# Patient Record
Sex: Male | Born: 2000 | Hispanic: Yes | Marital: Single | Smoking: Never smoker
Health system: Southern US, Community
[De-identification: ages and names within clinical notes are randomized; demographics above are authoritative.]

## PROBLEM LIST (undated history)

## (undated) DIAGNOSIS — J45909 Unspecified asthma, uncomplicated: Secondary | ICD-10-CM

---
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
## 2022-03-03 ENCOUNTER — Ambulatory Visit
Admission: EM | Admit: 2022-03-03 | Discharge: 2022-03-03 | Disposition: A | Discharge status: Home / Self Care | Payer: Medicaid Other

## 2022-03-03 DIAGNOSIS — M545 Low back pain, unspecified: Secondary | ICD-10-CM

## 2022-03-03 DIAGNOSIS — S39012A Strain of muscle, fascia and tendon of lower back, initial encounter: Secondary | ICD-10-CM

## 2022-03-03 HISTORY — DX: Unspecified asthma, uncomplicated: J45.909

## 2022-03-03 MED ORDER — CYCLOBENZAPRINE HCL 5 MG PO TABS: 5.0000 mg | ORAL_TABLET | Freq: Two times a day (BID) | ORAL | 0 refills | Status: DC | PRN

## 2022-03-03 MED ORDER — IBUPROFEN 600 MG PO TABS: 600.0000 mg | ORAL_TABLET | Freq: Four times a day (QID) | ORAL | 0 refills | Status: DC | PRN

## 2022-03-03 NOTE — Discharge Instructions (Signed)
It appears that you have strained some muscles in your lower back.  I have prescribed 2 different medications to help alleviate symptoms.  Cyclobenzaprine is a muscle relaxer and please be advised that it can cause drowsiness so do not drive or drink alcohol with taking it.  I have prescribed prescription ibuprofen as well.  Please do not take any additional ibuprofen, Advil, Aleve while taking this medication.  Alternate ice and heat to affected area.  Follow-up if symptoms persist or worsen.

## 2022-03-03 NOTE — ED Provider Notes (Signed)
[facility]    CSN: [id number] Arrival date & time: 03/03/22  1855      History   Chief Complaint Chief Complaint  Patient presents with   Back Pain    HPI Hector Cole is a 21 y.o. male.   Patient presents with lower back pain that has been present for about a week.  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  He has not taken any medications for pain but reports that he has applied Salonpas patches with minimal improvement.  Denies any numbness or tingling.  Pain does not radiate down legs.  Denies urinary frequency, urinary or bowel continence, saddle anesthesia.   Back Pain   Past Medical History:  Diagnosis Date   Asthma     There are no problems to display for this patient.   History reviewed. No pertinent surgical history.     Home Medications    Prior to Admission medications   Medication Sig Start Date End Date Taking? Authorizing Provider  albuterol (VENTOLIN HFA) 108 (90 Base) MCG/ACT inhaler Inhale into the lungs every 6 (six) hours as needed for wheezing or shortness of breath.   Yes [provider]  cyclobenzaprine (FLEXERIL) 5 MG tablet Take 1 tablet (5 mg total) by mouth 2 (two) times daily as needed for muscle spasms. 03/03/22  Yes Jayden Kratochvil, Rolly Salter E, FNP  ibuprofen (ADVIL) 600 MG tablet Take 1 tablet (600 mg total) by mouth every 6 (six) hours as needed for mild pain or moderate pain. 03/03/22  Yes Gustavus Bryant, FNP    Family History History reviewed. No pertinent family history.  Social History Social History   Tobacco Use   Smoking status: Never   Smokeless tobacco: Never  Vaping Use   Vaping Use: Never used  Substance Use Topics   Alcohol use: Never   Drug use: Never     Allergies   Patient has no known allergies.   Review of Systems Review of Systems Per HPI  Physical Exam Triage Vital Signs ED Triage Vitals   Enc Vitals Group     BP 03/03/22 1949 118/74     Pulse Rate 03/03/22 1949 75     Resp 03/03/22 1949 20     Temp 03/03/22 1949 98.5 F (36.9 C)     Temp Source 03/03/22 1949 Oral     SpO2 03/03/22 1949 98 %     Weight --      Height --      Head Circumference --      Peak Flow --      Pain Score 03/03/22 2005 7     Pain Loc --      Pain Edu? --      Excl. in GC? --    No data found.  Updated Vital Signs BP 118/74 (BP Location: Left Arm)   Pulse 75   Temp 98.5 F (36.9 C) (Oral)   Resp 20   SpO2 98%   Visual Acuity Right Eye Distance:   Left Eye Distance:   Bilateral Distance:    Right Eye Near:   Left Eye Near:    Bilateral Near:     Physical Exam Constitutional:      General: He is not in acute distress.    Appearance: Normal appearance. He is not toxic-appearing or diaphoretic.  HENT:     Head: Normocephalic and atraumatic.  Eyes:  Extraocular Movements: Extraocular movements intact.     Conjunctiva/sclera: Conjunctivae normal.  Pulmonary:     Effort: Pulmonary effort is normal.  Musculoskeletal:       Back:     Comments: Tenderness to palpation across lower lumbar region.  No crepitus or step-off noted.  No obvious swelling, discoloration, lacerations, abrasions noted.  Neurological:     General: No focal deficit present.     Mental Status: He is alert and oriented to person, place, and time. Mental status is at baseline.     Deep Tendon Reflexes: Reflexes are normal and symmetric.  Psychiatric:        Mood and Affect: Mood normal.        Behavior: Behavior normal.        Thought Content: Thought content normal.        Judgment: Judgment normal.      UC Treatments / Results  Labs (all labs ordered are listed, but only abnormal results are displayed) Labs Reviewed - No data to display  EKG   Radiology No results found.  Procedures Procedures (including critical care time)  Medications Ordered in UC Medications - No data to  display  Initial Impression / Assessment and Plan / UC Course  I have reviewed the triage vital signs and the nursing notes.  Pertinent labs & imaging results that were available during my care of the patient were reviewed by me and considered in my medical decision making (see chart for details).     Physical exam is consistent with muscle strain of the lumbar area.  Will treat with a muscle relaxer and NSAID.  Patient advised that muscle relaxer can cause drowsiness and do not drive or drink alcohol with taking it.  Advised patient not to take any additional NSAIDs as well.  Advised alternating ice and heat to affected area.  Do not think imaging is necessary given no direct spinal tenderness or direct injury.  Patient was advised to follow-up if symptoms persist or worsen.  Patient verbalized understanding and was agreeable with plan. Final Clinical Impressions(s) / UC Diagnoses   Final diagnoses:  Strain of lumbar region, initial encounter  Acute bilateral low back pain without sciatica     Discharge Instructions      It appears that you have strained some muscles in your lower back.  I have prescribed 2 different medications to help alleviate symptoms.  Cyclobenzaprine is a muscle relaxer and please be advised that it can cause drowsiness so do not drive or drink alcohol with taking it.  I have prescribed prescription ibuprofen as well.  Please do not take any additional ibuprofen, Advil, Aleve while taking this medication.  Alternate ice and heat to affected area.  Follow-up if symptoms persist or worsen.    ED Prescriptions     Medication Sig Dispense Auth. Provider   cyclobenzaprine (FLEXERIL) 5 MG tablet Take 1 tablet (5 mg total) by mouth 2 (two) times daily as needed for muscle spasms. 20 tablet [place], [place], [place]   ibuprofen (ADVIL) 600 MG tablet Take 1 tablet (600 mg total) by mouth every 6 (six) hours as needed for mild pain or moderate pain. 30 tablet [place], [place], [place]       I have reviewed the PDMP during this encounter.   Gustavus Bryant, [place] 03/03/22 2020

## 2022-03-03 NOTE — ED Triage Notes (Signed)
Pt reports generalized lumbar pain. 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  Used heat pad twice but only temporary improvement.

## 2022-03-14 ENCOUNTER — Emergency Department (HOSPITAL_COMMUNITY): Payer: Medicaid Other

## 2022-03-14 ENCOUNTER — Emergency Department (HOSPITAL_COMMUNITY)
Admission: EM | Admit: 2022-03-14 | Discharge: 2022-03-15 | Disposition: A | Discharge status: Home / Self Care | Payer: Medicaid Other | Attending: Emergency Medicine | Admitting: Emergency Medicine

## 2022-03-14 ENCOUNTER — Encounter (HOSPITAL_COMMUNITY): Payer: Self-pay | Admitting: Emergency Medicine

## 2022-03-14 DIAGNOSIS — N50812 Left testicular pain: Secondary | ICD-10-CM

## 2022-03-14 DIAGNOSIS — Z7951 Long term (current) use of inhaled steroids: Secondary | ICD-10-CM | POA: Diagnosis not present

## 2022-03-14 DIAGNOSIS — J45909 Unspecified asthma, uncomplicated: Secondary | ICD-10-CM | POA: Insufficient documentation

## 2022-03-14 LAB — URINALYSIS, ROUTINE W REFLEX MICROSCOPIC
Bilirubin Urine: NEGATIVE
Glucose, UA: NEGATIVE mg/dL
Hgb urine dipstick: NEGATIVE
Ketones, ur: NEGATIVE mg/dL
Leukocytes,Ua: NEGATIVE
Nitrite: NEGATIVE
Protein, ur: NEGATIVE mg/dL
Specific Gravity, Urine: 1.023 (ref 1.005–1.030)
pH: 6 (ref 5.0–8.0)

## 2022-03-14 NOTE — ED Triage Notes (Addendum)
Pt reports left testicular pain, denies pain with urination. Pain with walking. Denies trauma. Last year pt had same issue and US performed. He did not follow up with urology.  Reports no pain with intercourse except when testicle hits the other person.

## 2022-03-14 NOTE — ED Provider Triage Note (Signed)
Emergency Medicine Provider Triage Evaluation Note  Hector Cole , a 21 y.o. male  was evaluated in triage.  Pt complains of left testicle pain.   Denies trauma.  No urinary symptoms or penile discharge.  No fever.  Hx similar last year but never followed up with urology.  Review of Systems  Positive: Testicle pain Negative: fever  Physical Exam  BP 127/80 (BP Location: Right Arm)   Pulse 80   Temp 98.1 F (36.7 C) (Oral)   Resp 16   SpO2 99%  Gen:   Awake, no distress   Resp:  Normal effort  MSK:   Moves extremities without difficulty  Other:  Genital exam deferred in triage  Medical Decision Making  Medically screening exam initiated at 10:08 PM.  Appropriate orders placed.  Rodriques Badie was informed that the remainder of the evaluation will be completed by another provider, this initial triage assessment does not replace that evaluation, and the importance of remaining in the ED until their evaluation is complete.  Testicle pain.  Denies trauma, urinary symptoms, or discharge.  Will order [place] and UA.   Garlon Hatchet, PA-C 03/14/22 2210

## 2022-03-15 LAB — HIV ANTIBODY (ROUTINE TESTING W REFLEX): HIV Screen 4th Generation wRfx: NONREACTIVE

## 2022-03-15 NOTE — ED Provider Notes (Signed)
[facility] EMERGENCY DEPARTMENT Provider Note   CSN: [id number] Arrival date & time: 03/14/22  2049     History  Chief Complaint  Patient presents with   Testicle Pain    Hector Cole is a 21 y.o. male.  Patient presents emergency department complaining of left-sided testicle pain which been ongoing for approximately 1 week.  Patient denies any hematuria, dysuria, abdominal pain, back pain.  He does report unprotected sex.  He was seen with a similar issue last year at approximate the same time and was recommended to follow-up with urology but states he was unaware that he was was to follow-up or schedule a follow-up appointment.  Past medical history significant for asthma  HPI     Home Medications Prior to Admission medications   Medication Sig Start Date End Date Taking? Authorizing Provider  albuterol (VENTOLIN HFA) 108 (90 Base) MCG/ACT inhaler Inhale into the lungs every 6 (six) hours as needed for wheezing or shortness of breath.    [provider]  cyclobenzaprine (FLEXERIL) 5 MG tablet Take 1 tablet (5 mg total) by mouth 2 (two) times daily as needed for muscle spasms. 03/03/22   Gustavus Bryant, FNP  ibuprofen (ADVIL) 600 MG tablet Take 1 tablet (600 mg total) by mouth every 6 (six) hours as needed for mild pain or moderate pain. 03/03/22   Gustavus Bryant, FNP      Allergies    Patient has no known allergies.    Review of Systems   Review of Systems  Genitourinary:  Positive for testicular pain.    Physical Exam Updated Vital Signs BP 137/75 (BP Location: Right Arm)   Pulse 71   Temp 97.8 F (36.6 C) (Oral)   Resp 16   SpO2 100%  Physical Exam Exam conducted with a chaperone present.  HENT:     Head: Normocephalic and atraumatic.  Eyes:     Pupils: Pupils are equal, round, and reactive to light.  Pulmonary:     Effort: Pulmonary effort is normal. No respiratory distress.  Genitourinary:    Penis: Uncircumcised.       Testes:        Right: Mass, tenderness, swelling, testicular hydrocele or varicocele not present. Right testis is descended. Cremasteric reflex is present.         Left: Tenderness present. Mass, swelling, testicular hydrocele or varicocele not present. Left testis is descended. Cremasteric reflex is present.   Musculoskeletal:        General: No signs of injury.     Cervical back: Normal range of motion.  Skin:    General: Skin is dry.  Neurological:     Mental Status: He is alert.  Psychiatric:        Speech: Speech normal.        Behavior: Behavior normal.     ED Results / Procedures / Treatments   Labs (all labs ordered are listed, but only abnormal results are displayed) Labs Reviewed  URINALYSIS, ROUTINE W REFLEX MICROSCOPIC  HIV ANTIBODY (ROUTINE TESTING W REFLEX)  GC/CHLAMYDIA PROBE AMP ([place]) NOT AT [facility]    EKG None  Radiology US SCROTUM W/DOPPLER  Result Date: 03/15/2022 CLINICAL DATA:  Left testicle pain EXAM: SCROTAL ULTRASOUND DOPPLER ULTRASOUND OF THE TESTICLES TECHNIQUE: Complete ultrasound examination of the testicles, epididymis, and other scrotal structures was performed. Color and spectral Doppler ultrasound were also utilized to evaluate blood flow to the testicles. COMPARISON:  Ultrasound 04/16/2020 FINDINGS: Right testicle Measurements:  5.3 x 2.0 x 3.2 cm. No mass or microlithiasis visualized. Left testicle Measurements: 4.8 x 2.1 x 2.8 cm. No mass or microlithiasis visualized. Right epididymis:  Normal in size and appearance. Left epididymis:  Normal in size and appearance. Hydrocele:  Trace bilateral hydroceles. Varicocele:  None visualized. Pulsed Doppler interrogation of both testes demonstrates normal low resistance arterial and venous waveforms bilaterally. IMPRESSION: Trace bilateral hydroceles. Otherwise unremarkable testicular ultrasound. Electronically Signed   By: Minerva Fester M.D.   On: 03/15/2022 00:18    Procedures Procedures     Medications Ordered in ED Medications - No data to display  ED Course/ Medical Decision Making/ A&P                           Medical Decision Making  Patient presents to complaint of left-sided testicular pain.  Differential diagnosis includes epididymitis, torsion, injury, and others  I reviewed the patient's past medical history.  Patient was evaluated for similar complaints approximately 1 year ago.  It was recommended that he follow-up with urology at that time but the patient misunderstood the instructions.  No torsion was noted that time.  Small hydroceles were noted on that evaluation  I ordered and interpreted imaging including ultrasound of the testicles.  No torsion or other abnormality noted.  I agree with radiologist's findings  I ordered and reviewed lab results.  Pertinent results include unremarkable UA, negative HIV screen, GC chlamydia probe pending  There is no obvious emergent condition at this time.  Unclear etiology of patient's left-sided testicular pain.  There is no torsion or abnormal finding on ultrasound.  Physical exam was unremarkable except for tenderness.  Clinically this does not appear to epididymitis at this time.  Plan to discharge patient home with plans for follow-up as needed based on GC chlamydia probe.  Patient has been instructed clearly to schedule a follow-up appointment with urology for further evaluation and management.         Final Clinical Impression(s) / ED Diagnoses Final diagnoses:  Pain in left testicle    Rx / DC Orders ED Discharge Orders     None         Pamala Duffel 03/15/22 8938    Mesner, Barbara Cower, MD 03/15/22 [phone]

## 2022-03-15 NOTE — Discharge Instructions (Signed)
You were evaluated today for testicular pain.  Your ultrasound was reassuring for no signs of torsion at this time.  The testing for STIs is pending at this time.  Please follow-up MyChart for results of the testing.  You should be contacted if the test are positive start antibiotic therapy.  Please contact the listed urologist to schedule a follow-up appointment for further evaluation and management.  You may try over-the-counter pain medications at this time to see if they help with symptom relief

## 2022-03-17 LAB — GC/CHLAMYDIA PROBE AMP (~~LOC~~) NOT AT ARMC
Chlamydia: NEGATIVE
Comment: NEGATIVE
Comment: NORMAL
Neisseria Gonorrhea: NEGATIVE

## 2022-06-17 IMAGING — US US SCROTUM W/ DOPPLER COMPLETE
1 series · 14 of 25 positions shown · non-contrast
Comparison: None.

CLINICAL DATA: Left testicular pain

EXAM:
SCROTAL ULTRASOUND
DOPPLER ULTRASOUND OF THE TESTICLES
TECHNIQUE: Complete ultrasound examination of the testicles, epididymis, and
other scrotal structures was performed. Color and spectral Doppler
ultrasound were also utilized to evaluate blood flow to the
testicles.

[Series 1: us scrotum w/doppler · 14 of 60 slices shown]
[im 1/60]
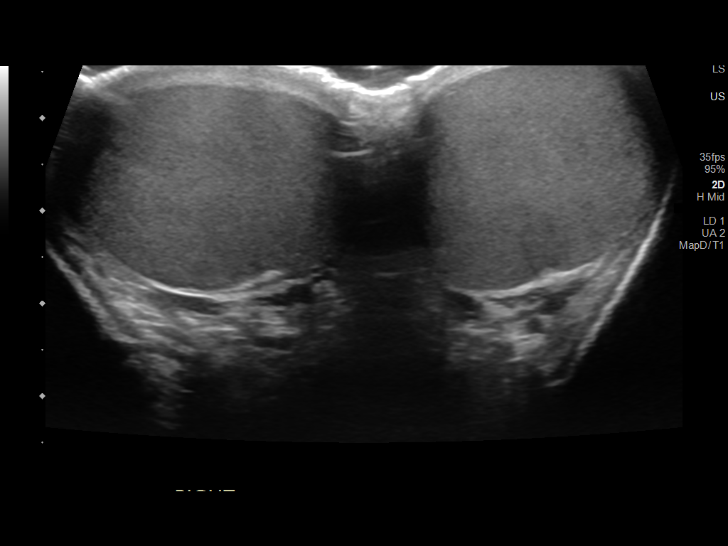
[im 5/60]
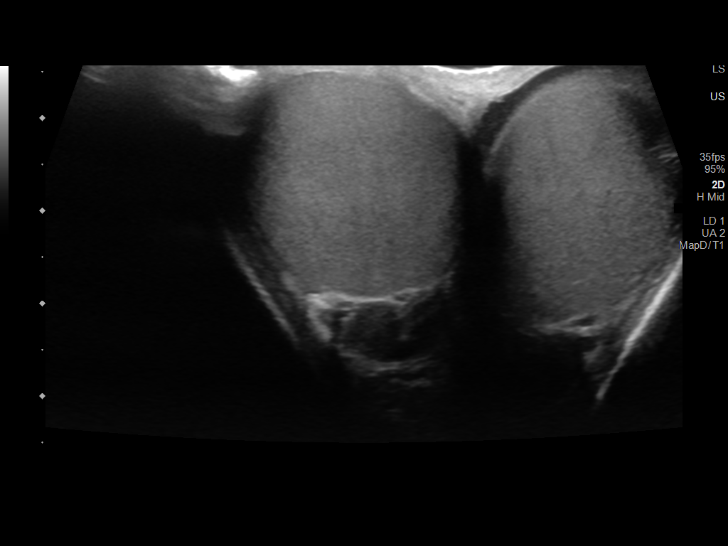
[im 10/60]
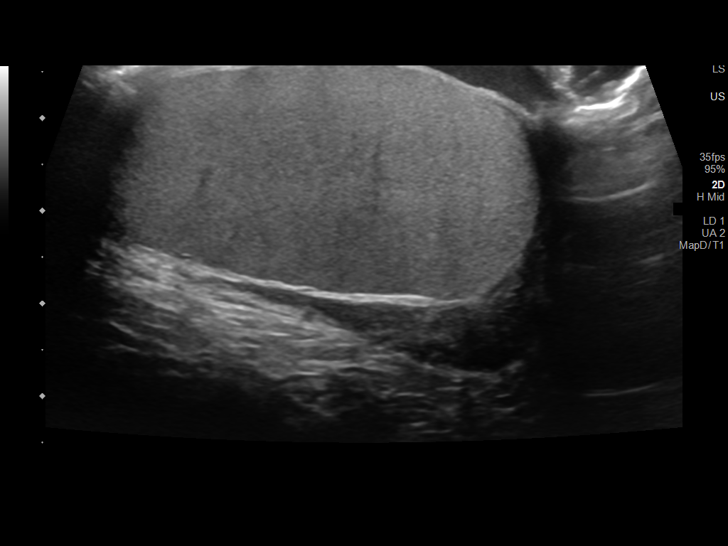
[im 15/60]
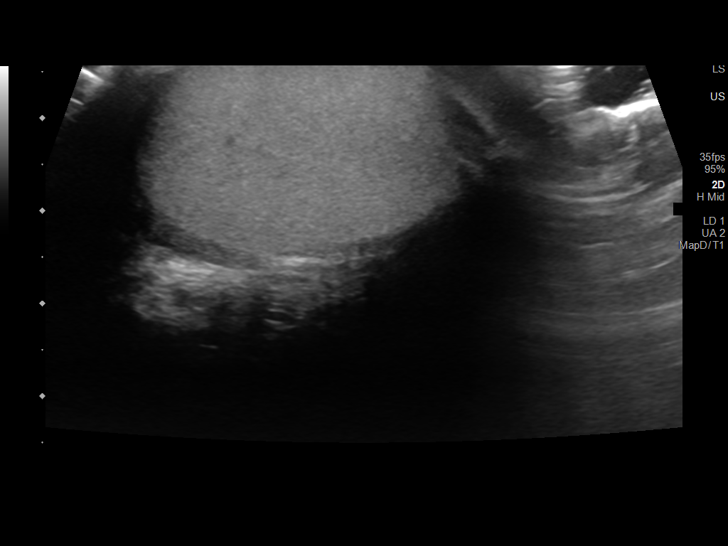
[im 20/60]
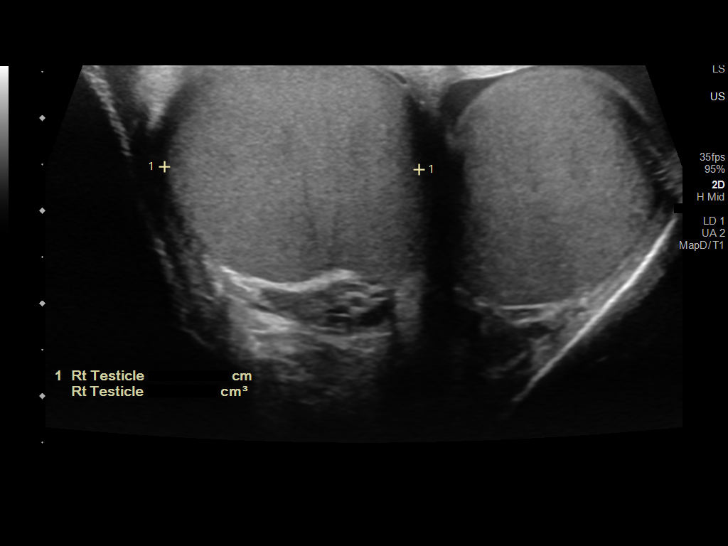
[im 23/60]
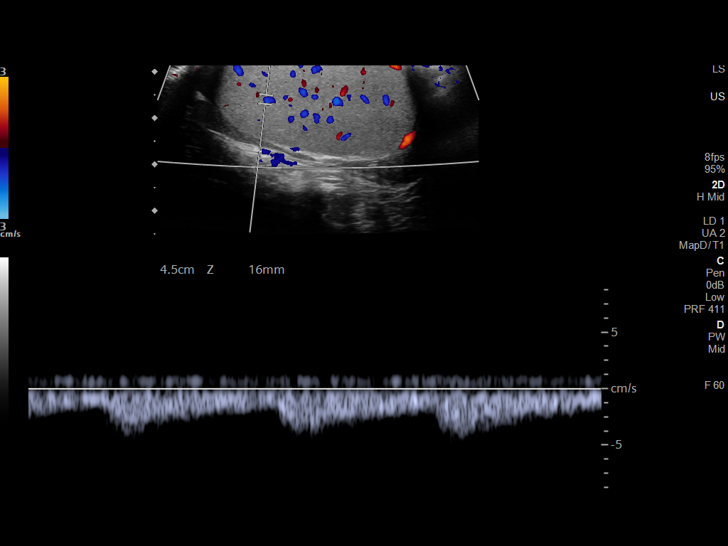
[im 28/60]
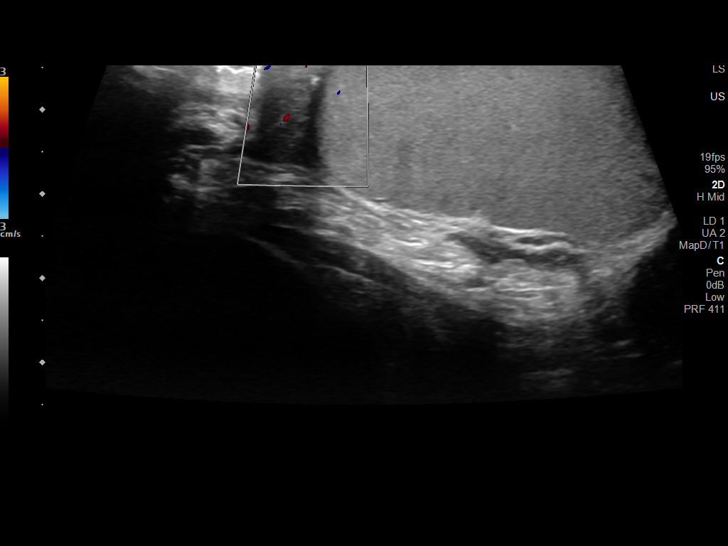
[im 32/60]
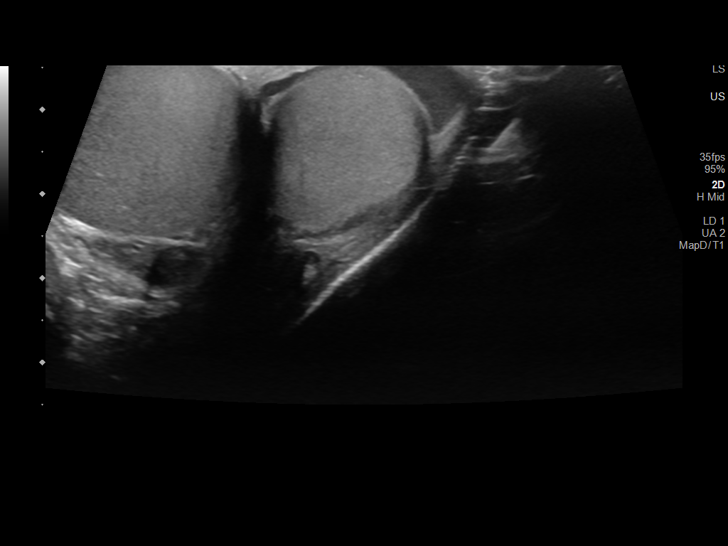
[im 37/60]
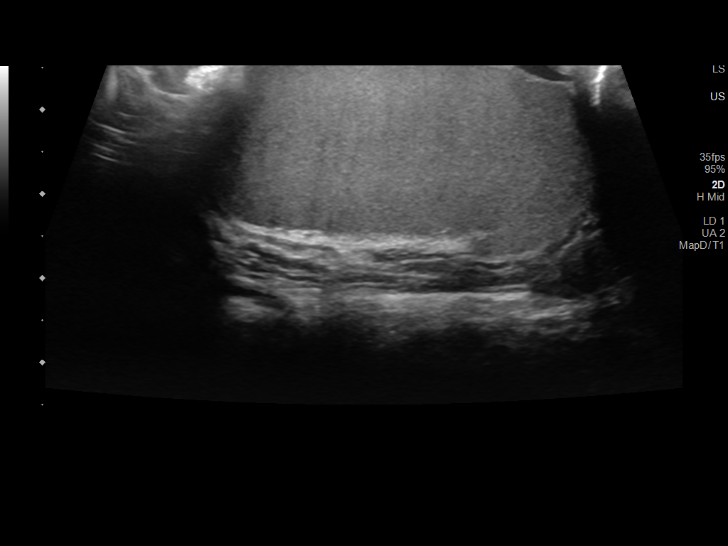
[im 40/60]
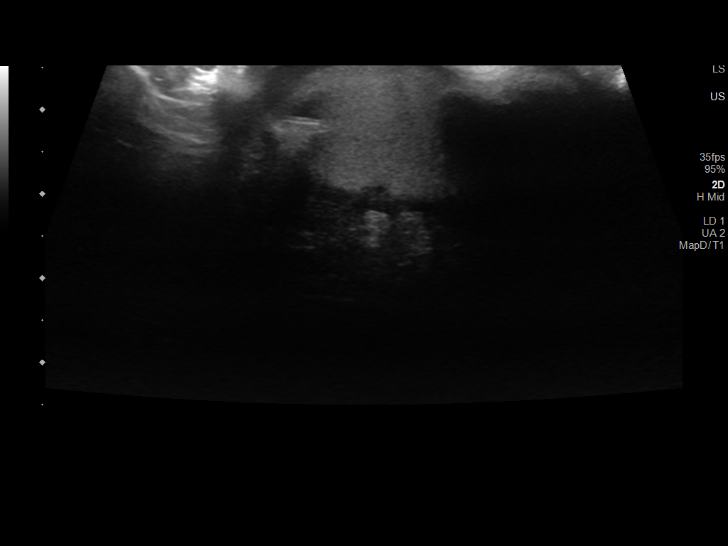
[im 45/60]
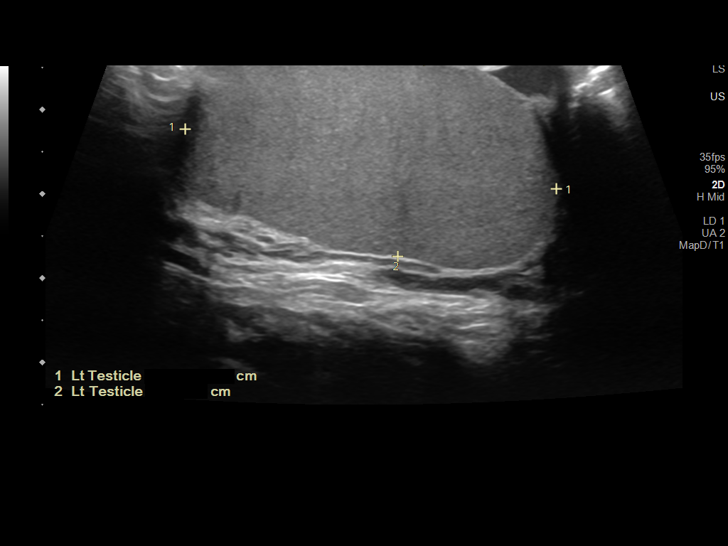
[im 50/60]
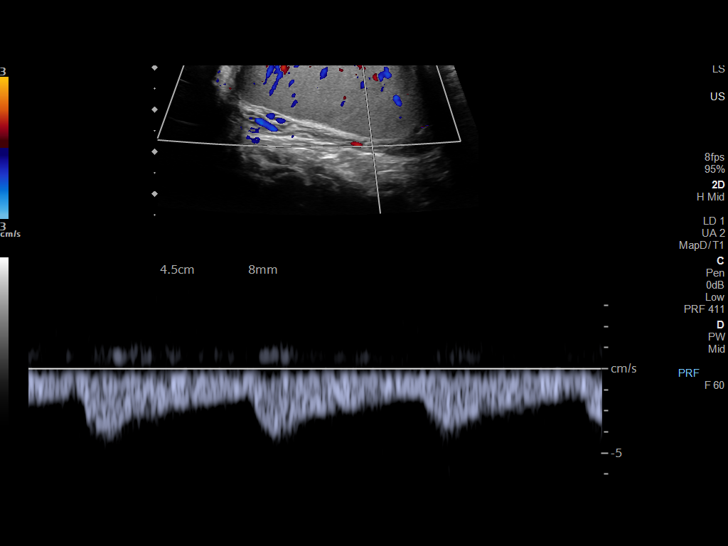
[im 55/60]
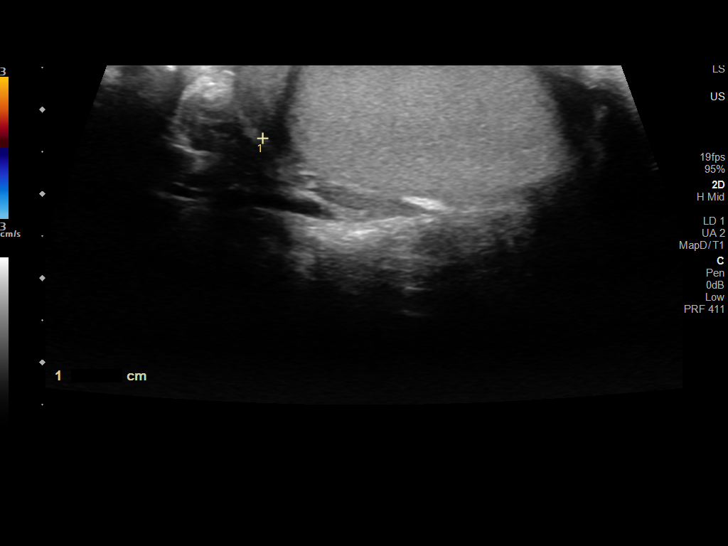
[im 60/60]
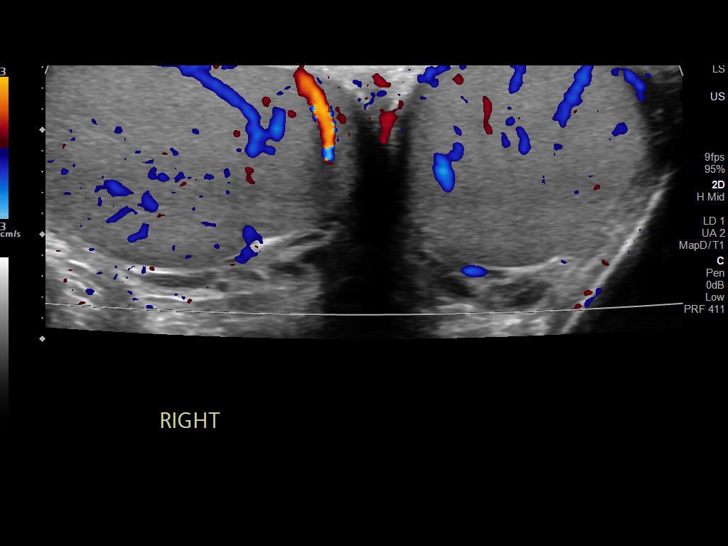

[14 of 25 positions shown; findings below may reference images not displayed]

FINDINGS: Right testicle

Measurements: 4.7 x 2.3 x 2.8 cm. No mass or microlithiasis
visualized.

Left testicle

Measurements: 4.5 x 2.4 x 2.7 cm. No mass or microlithiasis
visualized.

Right epididymis:  Normal in size and appearance.

Left epididymis:  Normal in size and appearance.

Hydrocele:  Trace bilateral hydroceles.

Varicocele:  None visualized.

Pulsed Doppler interrogation of both testes demonstrates normal low
resistance arterial and venous waveforms bilaterally.
IMPRESSION: 1. Trace bilateral hydroceles. Otherwise unremarkable testicular
ultrasound.

## 2022-06-22 ENCOUNTER — Ambulatory Visit: Payer: Self-pay

## 2022-06-22 ENCOUNTER — Ambulatory Visit
Admission: EM | Admit: 2022-06-22 | Discharge: 2022-06-22 | Disposition: A | Discharge status: Home / Self Care | Payer: Medicaid Other | Attending: Urgent Care | Admitting: Urgent Care

## 2022-06-22 DIAGNOSIS — J069 Acute upper respiratory infection, unspecified: Secondary | ICD-10-CM | POA: Diagnosis not present

## 2022-06-22 MED ORDER — AZITHROMYCIN 250 MG PO TABS: 250.0000 mg | ORAL_TABLET | Freq: Every day | ORAL | 0 refills | Status: DC

## 2022-06-22 MED ORDER — PREDNISONE 50 MG PO TABS: 50.0000 mg | ORAL_TABLET | Freq: Every day | ORAL | 0 refills | Status: DC

## 2022-06-22 MED ORDER — MONTELUKAST SODIUM 10 MG PO TABS: 10.0000 mg | ORAL_TABLET | Freq: Every day | ORAL | 0 refills | Status: DC

## 2022-06-22 MED ORDER — BENZONATATE 100 MG PO CAPS: 100.0000 mg | ORAL_CAPSULE | Freq: Three times a day (TID) | ORAL | 0 refills | Status: DC | PRN

## 2022-06-22 NOTE — ED Triage Notes (Signed)
Pt presents with c/o pressure in chst, sore throat, sneezing, cough, sinus pressure X 4 days.   States he has taken DayQuil and NyQuil.

## 2022-06-22 NOTE — Discharge Instructions (Addendum)
Please start taking the azithromycin this evening, 2 tablets today, followed by 1 tablet tomorrow through day 5.  Follow the package instructions. Please also start taking montelukast.  This will be 1 tablet every evening.  It may cause drowsiness so do not take in the morning. Take prednisone once daily every morning with breakfast. Use the benzonatate cough Perles only as needed for your cough. Try to stay hydrated with water, consider the use of over-the-counter Mucinex 600 mg twice daily.

## 2022-06-22 NOTE — ED Provider Notes (Signed)
[facility]    CSN: [id number] Arrival date & time: 06/22/22  1849      History   Chief Complaint Chief Complaint  Patient presents with   Nasal Congestion   Cough    HPI Abdirahim Kichline is a 22 y.o. male.   Pleasant 22 year old male with a known history of asthma in the past presents today due to concerns of nasal congestion, cough, and shortness of breath.  Patient has a home Ventolin inhaler, states he took it yesterday as his last dose.  Felt that it was ineffective for his current symptoms.  Denies any tightness in his chest today.  Denies any fever.  Endorses postnasal drainage, sneezing, and congestion.  Patient has taken DayQuil and NyQuil without any improvement of symptoms.   Cough   Past Medical History:  Diagnosis Date   Asthma     There are no problems to display for this patient.   History reviewed. No pertinent surgical history.     Home Medications    Prior to Admission medications   Medication Sig Start Date End Date Taking? Authorizing Provider  azithromycin (ZITHROMAX) 250 MG tablet Take 1 tablet (250 mg total) by mouth daily. Take first 2 tablets together, then 1 every day until finished. 06/22/22  Yes Akeiba Axelson L, PA  benzonatate (TESSALON PERLES) 100 MG capsule Take 1 capsule (100 mg total) by mouth 3 (three) times daily as needed for cough. 06/22/22  Yes Dajour Pierpoint L, PA  montelukast (SINGULAIR) 10 MG tablet Take 1 tablet (10 mg total) by mouth at bedtime. 06/22/22  Yes Rubee Vega L, PA  predniSONE (DELTASONE) 50 MG tablet Take 1 tablet (50 mg total) by mouth daily with breakfast. 06/22/22  Yes Liviah Cake L, PA  albuterol (VENTOLIN HFA) 108 (90 Base) MCG/ACT inhaler Inhale into the lungs every 6 (six) hours as needed for wheezing or shortness of breath.    [provider]  cyclobenzaprine (FLEXERIL) 5 MG tablet Take 1 tablet (5 mg total) by mouth 2 (two) times daily as needed for muscle spasms. 03/03/22   Teodora Medici, FNP  ibuprofen (ADVIL) 600 MG tablet Take 1 tablet (600 mg total) by mouth every 6 (six) hours as needed for mild pain or moderate pain. 03/03/22   Teodora Medici, FNP    Family History History reviewed. No pertinent family history.  Social History Social History   Tobacco Use   Smoking status: Never   Smokeless tobacco: Never  Vaping Use   Vaping Use: Never used  Substance Use Topics   Alcohol use: Never   Drug use: Never     Allergies   Patient has no known allergies.   Review of Systems Review of Systems  Respiratory:  Positive for cough.    As per HPI  Physical Exam Triage Vital Signs ED Triage Vitals  Enc Vitals Group     BP 06/22/22 1917 (!) 134/90     Pulse Rate 06/22/22 1917 83     Resp 06/22/22 1917 18     Temp 06/22/22 1917 97.7 F (36.5 C)     Temp Source 06/22/22 1917 Oral     SpO2 06/22/22 1917 97 %     Weight --      Height --      Head Circumference --      Peak Flow --      Pain Score 06/22/22 1915 2     Pain Loc --  Pain Edu? --      Excl. in [place]? --    No data found.  Updated Vital Signs BP (!) 134/90 (BP Location: Right Arm)   Pulse 83   Temp 97.7 F (36.5 C) (Oral)   Resp 18   SpO2 97%   Visual Acuity Right Eye Distance:   Left Eye Distance:   Bilateral Distance:    Right Eye Near:   Left Eye Near:    Bilateral Near:     Physical Exam Vitals and nursing note reviewed.  Constitutional:      General: He is not in acute distress.    Appearance: Normal appearance. He is normal weight. He is not ill-appearing, toxic-appearing or diaphoretic.  HENT:     Head: Normocephalic and atraumatic.     Right Ear: Tympanic membrane and ear canal normal. No drainage, swelling or tenderness. No middle ear effusion. Tympanic membrane is not erythematous.     Left Ear: Tympanic membrane and ear canal normal. No drainage, swelling or tenderness.  No middle ear effusion. Tympanic membrane is not erythematous.     Nose: Congestion and  rhinorrhea present.     Mouth/Throat:     Mouth: Mucous membranes are moist. No oral lesions.     Pharynx: Oropharyngeal exudate present. No pharyngeal swelling, posterior oropharyngeal erythema or uvula swelling.     Tonsils: No tonsillar exudate or tonsillar abscesses.     Comments: Thick mucopurulent discharge in posterior pharynx Eyes:     General: No scleral icterus.       Right eye: No discharge.        Left eye: No discharge.     Extraocular Movements:     Left eye: Normal extraocular motion.     Conjunctiva/sclera: Conjunctivae normal.     Pupils: Pupils are equal, round, and reactive to light.  Neck:     Thyroid: No thyromegaly.  Cardiovascular:     Rate and Rhythm: Normal rate and regular rhythm.     Heart sounds: Normal heart sounds. No murmur heard.    No friction rub. No gallop.  Pulmonary:     Effort: Pulmonary effort is normal. No respiratory distress.     Breath sounds: No stridor. Rhonchi present. No wheezing or rales.  Chest:     Chest wall: No tenderness.  Abdominal:     Palpations: Abdomen is soft.  Musculoskeletal:     Cervical back: Normal range of motion and neck supple.  Lymphadenopathy:     Cervical: No cervical adenopathy.  Skin:    General: Skin is warm.     Capillary Refill: Capillary refill takes less than 2 seconds.     Coloration: Skin is not pale.     Findings: No erythema or rash.  Neurological:     General: No focal deficit present.     Mental Status: He is alert and oriented to person, place, and time.  Psychiatric:        Mood and Affect: Mood normal.        Behavior: Behavior normal.      UC Treatments / Results  Labs (all labs ordered are listed, but only abnormal results are displayed) Labs Reviewed - No data to display  EKG   Radiology No results found.  Procedures Procedures (including critical care time)  Medications Ordered in UC Medications - No data to display  Initial Impression / Assessment and Plan / UC  Course  I have reviewed the triage vital signs and the  nursing notes.  Pertinent labs & imaging results that were available during my care of the patient were reviewed by me and considered in my medical decision making (see chart for details).     Acute URI -patient with history of asthma and allergies, with acute onset of worsening symptoms.  Given appearance of nasal congestion and throat, suspect bacterial in nature.  Will start azithromycin, prednisone, montelukast.  As needed benzonatate.  Return to clinic precautions discussed.   Final Clinical Impressions(s) / UC Diagnoses   Final diagnoses:  Acute upper respiratory infection     Discharge Instructions      Please start taking the azithromycin this evening, 2 tablets today, followed by 1 tablet tomorrow through day 5.  Follow the package instructions. Please also start taking montelukast.  This will be 1 tablet every evening.  It may cause drowsiness so do not take in the morning. Take prednisone once daily every morning with breakfast. Use the benzonatate cough Perles only as needed for your cough. Try to stay hydrated with water, consider the use of over-the-counter Mucinex 600 mg twice daily.     ED Prescriptions     Medication Sig Dispense Auth. Provider   predniSONE (DELTASONE) 50 MG tablet Take 1 tablet (50 mg total) by mouth daily with breakfast. 6 tablet Ayasha Ellingsen L, PA   azithromycin (ZITHROMAX) 250 MG tablet Take 1 tablet (250 mg total) by mouth daily. Take first 2 tablets together, then 1 every day until finished. 6 tablet Xitlalli Newhard L, PA   montelukast (SINGULAIR) 10 MG tablet Take 1 tablet (10 mg total) by mouth at bedtime. 30 tablet Brynlei Klausner L, PA   benzonatate (TESSALON PERLES) 100 MG capsule Take 1 capsule (100 mg total) by mouth 3 (three) times daily as needed for cough. 20 capsule Cianni Manny L, PA      PDMP not reviewed this encounter.   Chaney Malling, [place] 06/22/22 2044

## 2022-07-13 ENCOUNTER — Ambulatory Visit
Admission: RE | Admit: 2022-07-13 | Discharge: 2022-07-13 | Disposition: A | Discharge status: Home / Self Care | Payer: Medicaid Other | Source: Ambulatory Visit | Attending: Emergency Medicine | Admitting: Emergency Medicine

## 2022-07-13 VITALS — BP 122/70 | HR 68 | Temp 98.4°F | Resp 17

## 2022-07-13 DIAGNOSIS — S60459A Superficial foreign body of unspecified finger, initial encounter: Secondary | ICD-10-CM | POA: Diagnosis not present

## 2022-07-13 MED ORDER — IBUPROFEN 800 MG PO TABS
800.0000 mg | ORAL_TABLET | Freq: Once | ORAL | Status: AC
  Administered 2022-07-13: 800 mg via ORAL

## 2022-07-13 MED ORDER — SULFAMETHOXAZOLE-TRIMETHOPRIM 800-160 MG PO TABS: 1.0000 | ORAL_TABLET | Freq: Two times a day (BID) | ORAL | 0 refills | Status: AC

## 2022-07-13 NOTE — ED Provider Notes (Signed)
[facility]    CSN: [id number] Arrival date & time: 07/13/22  1155    HISTORY   Chief Complaint  Patient presents with   Finger Injury    Entered by patient   HPI Hector Cole is a pleasant, 22 y.o. male who presents to urgent care today. Patient complains of getting a splinter in the tip of his right index finger at work today, states it is a piece of wood.  Patient states he does not know what to do about it.  The history is provided by the patient.   Past Medical History:  Diagnosis Date   Asthma    There are no problems to display for this patient.  History reviewed. No pertinent surgical history.  Home Medications    Prior to Admission medications   Medication Sig Start Date End Date Taking? Authorizing Provider  sulfamethoxazole-trimethoprim (BACTRIM DS) 800-160 MG tablet Take 1 tablet by mouth 2 (two) times daily for 5 days. 07/13/22 07/18/22 Yes Theadora Rama Scales, PA-C  albuterol (VENTOLIN HFA) 108 (90 Base) MCG/ACT inhaler Inhale into the lungs every 6 (six) hours as needed for wheezing or shortness of breath.    [provider]    Family History History reviewed. No pertinent family history. Social History Social History   Tobacco Use   Smoking status: Never   Smokeless tobacco: Never  Vaping Use   Vaping Use: Never used  Substance Use Topics   Alcohol use: Never   Drug use: Never   Allergies   Patient has no known allergies.  Review of Systems Review of Systems Pertinent findings revealed after performing a 14 point review of systems has been noted in the history of present illness.  Physical Exam Vital Signs BP 122/70 (BP Location: Right Arm)   Pulse 68   Temp 98.4 F (36.9 C) (Oral)   Resp 17   SpO2 97%   No data found.  Physical Exam Vitals and nursing note reviewed.  Constitutional:      General: He is not in acute distress.    Appearance: Normal appearance. He is normal weight. He is not ill-appearing.   HENT:     Head: Normocephalic and atraumatic.  Eyes:     Extraocular Movements: Extraocular movements intact.     Conjunctiva/sclera: Conjunctivae normal.     Pupils: Pupils are equal, round, and reactive to light.  Cardiovascular:     Rate and Rhythm: Normal rate and regular rhythm.  Pulmonary:     Effort: Pulmonary effort is normal.     Breath sounds: Normal breath sounds.  Musculoskeletal:        General: Normal range of motion.     Cervical back: Normal range of motion and neck supple.  Skin:    General: Skin is warm and dry.     Comments: Small wound at point of entry of splinter appreciated tip of right index finger  Neurological:     General: No focal deficit present.     Mental Status: He is alert and oriented to person, place, and time. Mental status is at baseline.  Psychiatric:        Mood and Affect: Mood normal.        Behavior: Behavior normal.        Thought Content: Thought content normal.        Judgment: Judgment normal.     Visual Acuity Right Eye Distance:   Left Eye Distance:   Bilateral Distance:    Right  Eye Near:   Left Eye Near:    Bilateral Near:     UC Couse / Diagnostics / Procedures:     Radiology No results found.  Procedures Procedures (including critical care time) EKG  Pending results:  Labs Reviewed - No data to display  Medications Ordered in UC: Medications  ibuprofen (ADVIL) tablet 800 mg (800 mg Oral Given 07/13/22 1216)    UC Diagnoses / Final Clinical Impressions(s)   I have reviewed the triage vital signs and the nursing notes.  Pertinent labs & imaging results that were available during my care of the patient were reviewed by me and considered in my medical decision making (see chart for details).    Final diagnoses:  Splinter of finger without major open wound or infection, initial encounter   Patient advised to soak finger and allow the splinter to come out on its own.  Patient provided with a 5-day course of  Bactrim for infection prevention.  Return precautions advised.  Please see discharge instructions below for details of plan of care as provided to patient. ED Prescriptions     Medication Sig Dispense Auth. Provider   sulfamethoxazole-trimethoprim (BACTRIM DS) 800-160 MG tablet Take 1 tablet by mouth 2 (two) times daily for 5 days. 10 tablet Theadora Rama Scales, PA-C      PDMP not reviewed this encounter.  Pending results:  Labs Reviewed - No data to display  Discharge Instructions:   Discharge Instructions      Please soak your finger in warm water with some Epsom salt added for 20 minutes at least 2-3 times daily.  Please begin Bactrim 1 tablet twice daily for the next 5 days to prevent infection.  You are welcome to take ibuprofen as needed for pain.    Disposition Upon Discharge:  Condition: stable for discharge home  Patient presented with an acute illness with associated systemic symptoms and significant discomfort requiring urgent management. In my opinion, this is a condition that a prudent lay person (someone who possesses an average knowledge of health and medicine) may potentially expect to result in complications if not addressed urgently such as respiratory distress, impairment of bodily function or dysfunction of bodily organs.   Routine symptom specific, illness specific and/or disease specific instructions were discussed with the patient and/or caregiver at length.   As such, the patient has been evaluated and assessed, work-up was performed and treatment was provided in alignment with urgent care protocols and evidence based medicine.  Patient/parent/caregiver has been advised that the patient may require follow up for further testing and treatment if the symptoms continue in spite of treatment, as clinically indicated and appropriate.  Patient/parent/caregiver has been advised to return to the [facility] or PCP if no better; to PCP or the Emergency Department if new  signs and symptoms develop, or if the current signs or symptoms continue to change or worsen for further workup, evaluation and treatment as clinically indicated and appropriate  The patient will follow up with their current PCP if and as advised. If the patient does not currently have a PCP we will assist them in obtaining one.   The patient may need specialty follow up if the symptoms continue, in spite of conservative treatment and management, for further workup, evaluation, consultation and treatment as clinically indicated and appropriate.  Patient/parent/caregiver verbalized understanding and agreement of plan as discussed.  All questions were addressed during visit.  Please see discharge instructions below for further details of plan.  This office  note has been dictated using Teaching laboratory technician.  Unfortunately, this method of dictation can sometimes lead to typographical or grammatical errors.  I apologize for your inconvenience in advance if this occurs.  Please do not hesitate to reach out to me if clarification is needed.      Theadora Rama Scales, PA-C 07/13/22 1217

## 2022-07-13 NOTE — ED Triage Notes (Signed)
Pt was at work today and obtained splinter in right pointer finger.

## 2022-07-13 NOTE — Discharge Instructions (Signed)
Please soak your finger in warm water with some Epsom salt added for 20 minutes at least 2-3 times daily.  Please begin Bactrim 1 tablet twice daily for the next 5 days to prevent infection.  You are welcome to take ibuprofen as needed for pain.

## 2023-08-06 ENCOUNTER — Other Ambulatory Visit: Payer: Self-pay

## 2023-08-06 ENCOUNTER — Encounter (HOSPITAL_BASED_OUTPATIENT_CLINIC_OR_DEPARTMENT_OTHER): Payer: Self-pay | Admitting: Emergency Medicine

## 2023-08-06 ENCOUNTER — Emergency Department (HOSPITAL_BASED_OUTPATIENT_CLINIC_OR_DEPARTMENT_OTHER): Admitting: Radiology

## 2023-08-06 ENCOUNTER — Emergency Department (HOSPITAL_BASED_OUTPATIENT_CLINIC_OR_DEPARTMENT_OTHER)
Admission: EM | Admit: 2023-08-06 | Discharge: 2023-08-06 | Disposition: A | Discharge status: Home / Self Care | Attending: Emergency Medicine | Admitting: Emergency Medicine

## 2023-08-06 ENCOUNTER — Ambulatory Visit: Payer: Self-pay

## 2023-08-06 ENCOUNTER — Other Ambulatory Visit (HOSPITAL_BASED_OUTPATIENT_CLINIC_OR_DEPARTMENT_OTHER): Payer: Self-pay

## 2023-08-06 DIAGNOSIS — R944 Abnormal results of kidney function studies: Secondary | ICD-10-CM | POA: Diagnosis not present

## 2023-08-06 DIAGNOSIS — J45909 Unspecified asthma, uncomplicated: Secondary | ICD-10-CM | POA: Insufficient documentation

## 2023-08-06 DIAGNOSIS — R0789 Other chest pain: Secondary | ICD-10-CM | POA: Insufficient documentation

## 2023-08-06 DIAGNOSIS — R1013 Epigastric pain: Secondary | ICD-10-CM | POA: Diagnosis not present

## 2023-08-06 LAB — URINALYSIS, ROUTINE W REFLEX MICROSCOPIC
Bilirubin Urine: NEGATIVE
Glucose, UA: NEGATIVE mg/dL
Hgb urine dipstick: NEGATIVE
Ketones, ur: NEGATIVE mg/dL
Leukocytes,Ua: NEGATIVE
Nitrite: NEGATIVE
Specific Gravity, Urine: 1.038 — ABNORMAL HIGH (ref 1.005–1.030)
pH: 6.5 (ref 5.0–8.0)

## 2023-08-06 LAB — COMPREHENSIVE METABOLIC PANEL WITH GFR
ALT: 16 U/L (ref 0–44)
AST: 20 U/L (ref 15–41)
Albumin: 4.5 g/dL (ref 3.5–5.0)
Alkaline Phosphatase: 79 U/L (ref 38–126)
Anion gap: 10 (ref 5–15)
BUN: 21 mg/dL — ABNORMAL HIGH (ref 6–20)
CO2: 25 mmol/L (ref 22–32)
Calcium: 9.7 mg/dL (ref 8.9–10.3)
Chloride: 104 mmol/L (ref 98–111)
Creatinine, Ser: 0.98 mg/dL (ref 0.61–1.24)
GFR, Estimated: >60 mL/min (ref >60)
Glucose, Bld: 91 mg/dL (ref 70–99)
Potassium: 4.1 mmol/L (ref 3.5–5.1)
Sodium: 140 mmol/L (ref 135–145)
Total Bilirubin: 0.5 mg/dL (ref 0.0–1.2)
Total Protein: 6.6 g/dL (ref 6.5–8.1)

## 2023-08-06 LAB — CBC
HCT: 42.7 % (ref 39.0–52.0)
Hemoglobin: 14.9 g/dL (ref 13.0–17.0)
MCH: 29.2 pg (ref 26.0–34.0)
MCHC: 34.9 g/dL (ref 30.0–36.0)
MCV: 83.7 fL (ref 80.0–100.0)
Platelets: 246 10*3/uL (ref 150–400)
RBC: 5.1 MIL/uL (ref 4.22–5.81)
RDW: 12.2 % (ref 11.5–15.5)
WBC: 3.9 10*3/uL — ABNORMAL LOW (ref 4.0–10.5)
nRBC: 0 % (ref 0.0–0.2)

## 2023-08-06 LAB — LIPASE, BLOOD: Lipase: 15 U/L (ref 11–51)

## 2023-08-06 MED ORDER — FAMOTIDINE 20 MG PO TABS
20.0000 mg | ORAL_TABLET | Freq: Once | ORAL | Status: AC
  Administered 2023-08-06: 20 mg via ORAL
  Filled 2023-08-06: qty 1

## 2023-08-06 MED ORDER — PANTOPRAZOLE SODIUM 20 MG PO TBEC
20.0000 mg | DELAYED_RELEASE_TABLET | Freq: Every day | ORAL | 1 refills | Status: AC
  Filled 2023-08-06: qty 30, 30d supply, fill #0

## 2023-08-06 MED ORDER — SUCRALFATE 1 G PO TABS
1.0000 g | ORAL_TABLET | Freq: Three times a day (TID) | ORAL | 0 refills | Status: AC
  Filled 2023-08-06: qty 90, 23d supply, fill #0

## 2023-08-06 MED ORDER — ALUM & MAG HYDROXIDE-SIMETH 200-200-20 MG/5ML PO SUSP
30.0000 mL | Freq: Once | ORAL | Status: AC
  Administered 2023-08-06: 30 mL via ORAL
  Filled 2023-08-06: qty 30

## 2023-08-06 NOTE — ED Notes (Signed)
 Pt is aware that a urine sample is needed, pt will try again at a later time.

## 2023-08-06 NOTE — ED Triage Notes (Signed)
 Pt reports neck chest and abd pressure when  eating that started yesterday

## 2023-08-06 NOTE — ED Provider Notes (Signed)
 [facility] Provider Note   CSN: [id number] Arrival date & time: 08/06/23  0848     History  Chief Complaint  Patient presents with   Abdominal Pain    Hector Cole is a 23 y.o. male.   Abdominal Pain   23 year old male presents emergency department with complaints of chest/abdominal pain.  States that symptoms began yesterday morning around 9 AM.  Reports some discomfort in his chest.  States that whenever he would eat or drink, the discomfort will get worse.  Reports pain that would radiate down to his stomach.  States that he took over-the-counter omeprazole as well as antiacid without significant improvement of symptoms.  Denies feelings of food getting caught in his throat.  Denies any nausea, vomiting.  States he does feel short of breath when the pain gets worse.  Denies any fevers, chills, urinary symptoms, change in bowel habits.  Denies history of similar symptoms in the past.  Past medical history significant for asthma  Home Medications Prior to Admission medications   Medication Sig Start Date End Date Taking? Authorizing Provider  albuterol (VENTOLIN HFA) 108 (90 Base) MCG/ACT inhaler Inhale into the lungs every 6 (six) hours as needed for wheezing or shortness of breath.    [provider]      Allergies    Patient has no known allergies.    Review of Systems   Review of Systems  Gastrointestinal:  Positive for abdominal pain.  All other systems reviewed and are negative.   Physical Exam Updated Vital Signs BP 132/72 (BP Location: Right Arm)   Pulse 68   Temp 98.1 F (36.7 C) (Temporal)   Resp 18   Ht 5\' 9"  (1.753 m)   Wt 72.6 kg   SpO2 99%   BMI 23.63 kg/m  Physical Exam Vitals and nursing note reviewed.  Constitutional:      General: He is not in acute distress.    Appearance: He is well-developed.  HENT:     Head: Normocephalic and atraumatic.  Eyes:     Conjunctiva/sclera:  Conjunctivae normal.  Cardiovascular:     Rate and Rhythm: Normal rate and regular rhythm.     Heart sounds: No murmur heard. Pulmonary:     Effort: Pulmonary effort is normal. No respiratory distress.     Breath sounds: Normal breath sounds. No wheezing, rhonchi or rales.  Chest:     Chest wall: No tenderness.  Abdominal:     Palpations: Abdomen is soft.     Tenderness: There is abdominal tenderness in the epigastric area. There is no right CVA tenderness or left CVA tenderness.  Musculoskeletal:        General: No swelling.     Cervical back: Neck supple.  Skin:    General: Skin is warm and dry.     Capillary Refill: Capillary refill takes less than 2 seconds.  Neurological:     Mental Status: He is alert.  Psychiatric:        Mood and Affect: Mood normal.     ED Results / Procedures / Treatments   Labs (all labs ordered are listed, but only abnormal results are displayed) Labs Reviewed  COMPREHENSIVE METABOLIC PANEL WITH GFR  CBC  LIPASE, BLOOD  URINALYSIS, ROUTINE W REFLEX MICROSCOPIC    EKG None  Radiology No results found.  Procedures Procedures    Medications Ordered in ED Medications - No data to display  ED Course/ Medical Decision Making/ A&P  Medical Decision Making Amount and/or Complexity of Data Reviewed Labs: ordered. Radiology: ordered.  Risk OTC drugs.   This patient presents to the ED for concern of abdominal pain, this involves an extensive number of treatment options, and is a complaint that carries with it a high risk of complications and morbidity.  The differential diagnosis includes gastritis, PUD, cholecystitis, CBD pathology, pancreatitis, SBO/LBO, volvulus, diverticulitis, appendicitis, other   Co morbidities that complicate the patient evaluation  See HPI   Additional history obtained:  Additional history obtained from EMR External records from outside source obtained and reviewed  including hospital records   Lab Tests:  I Ordered, and personally interpreted labs.  The pertinent results include: Leukopenia 3.9.  No evidence of anemia.  Placed within range.  UA with trace proteins otherwise unremarkable.  No electrolyte abnormalities.  Isolated mild elevation of BUN of 21.  No renal dysfunction.  No transaminitis.  Lipase within normal limits.   Imaging Studies ordered:  I ordered imaging studies including chest x-ray I independently visualized and interpreted imaging which showed no acute cardiopulmonary abnormality I agree with the radiologist interpretation   Cardiac Monitoring: / EKG:  The patient was maintained on a cardiac monitor.  I personally viewed and interpreted the cardiac monitored which showed an underlying rhythm of: Sinus rhythm.  T wave inversion V1.   Consultations Obtained:  N/a   Problem List / ED Course / Critical interventions / Medication management  Epigastric abdominal pain I ordered medication including Maalox, Pepcid   Reevaluation of the patient after these medicines showed that the patient improved I have reviewed the patients home medicines and have made adjustments as needed   Social Determinants of Health:  Denies tobacco, illicit drug use.   Test / Admission - Considered:  Epigastric abdominal pain Vitals signs within normal range and stable throughout visit. Laboratory/imaging studies significant for: See above 23 year old male presents emergency department with complaints of chest/abdominal pain.  States that symptoms began yesterday morning around 9 AM.  Reports some discomfort in his chest.  States that whenever he would eat or drink, the discomfort will get worse.  Reports pain that would radiate down to his stomach.  States that he took over-the-counter omeprazole as well as antiacid without significant improvement of symptoms.  Denies feelings of food getting caught in his throat.  Denies any nausea, vomiting.   States he does feel short of breath when the pain gets worse.  Denies any fevers, chills, urinary symptoms, change in bowel habits.  Denies history of similar symptoms in the past. On exam, reproducible tenderness epigastric region.  Workup today reassuring.  Chest x-ray without obvious pneumothorax, pneumonia, pneumomediastinum or other acute cardiopulmonary abnormality.  EKG appeared normal.  Laboratory studies with nonspecific leukopenia slightly; will recommend follow-up with primary care for repeat blood test.  Isolated mild elevation of BUN 21 with normal renal function otherwise.  Suspect patient's symptoms likely secondary to GERD versus gastritis versus esophagitis.  Was treated with GI cocktail and did note improvement of symptoms while in the ED.  Symptoms seem less likely related to cardiac or pulmonary cause as closely tied to worsening of symptoms with consumption of food/liquids.  Will recommend lifestyle/dietary modifications as described in AVS, beginning PPI and follow-up with PCP/GI in the outpatient setting for reassessment.  Treatment plan discussed with patient and he acknowledged understanding was agreeable to said plan.  Patient overall well-appearing, afebrile in no acute distress. Worrisome signs and symptoms were discussed with the patient,  and the patient acknowledged understanding to return to the ED if noticed. Patient was stable upon discharge.          Final Clinical Impression(s) / ED Diagnoses Final diagnoses:  None    Rx / DC Orders ED Discharge Orders     None         [place], [place] 08/06/23 1051    Flonnie Humphrey, DO 08/06/23 1601

## 2023-08-06 NOTE — ED Notes (Signed)
 Discharge paperwork given and verbally understood.

## 2023-08-06 NOTE — Discharge Instructions (Signed)
 As discussed, I suspect your symptoms are likely secondary to irritation of your esophagus/stomach concerning for GERD/esophagitis.  Will place you on a couple different medicines for treatment of your symptoms.  See information attached your discharge papers regarding lifestyle/dietary changes to decrease likelihood of making the pain worse.  Recommend follow-up with GI in the outpatient setting.  Please do not hesitate to return to emergency department if the worrisome signs and symptoms we discussed become apparent.
